# Patient Record
Sex: Female | Born: 2000 | Race: White | Hispanic: No | Smoking: Former smoker
Health system: Southern US, Community
[De-identification: ages and names within clinical notes are randomized; demographics above are authoritative.]

## PROBLEM LIST (undated history)

## (undated) DIAGNOSIS — F32A Depression, unspecified: Secondary | ICD-10-CM

## (undated) DIAGNOSIS — K219 Gastro-esophageal reflux disease without esophagitis: Secondary | ICD-10-CM

## (undated) DIAGNOSIS — J189 Pneumonia, unspecified organism: Secondary | ICD-10-CM

## (undated) HISTORY — PX: ADENOIDECTOMY: SUR15

## (undated) HISTORY — DX: Depression, unspecified: F32.A

## (undated) HISTORY — PX: TONSILLECTOMY: SUR1361

---
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
## 2022-01-02 ENCOUNTER — Ambulatory Visit: Payer: Medicaid Other

## 2023-03-03 ENCOUNTER — Ambulatory Visit (INDEPENDENT_AMBULATORY_CARE_PROVIDER_SITE_OTHER): Payer: MEDICAID | Admitting: Physician Assistant

## 2023-03-03 DIAGNOSIS — Z91199 Patient's noncompliance with other medical treatment and regimen due to unspecified reason: Secondary | ICD-10-CM

## 2023-03-03 NOTE — Progress Notes (Unsigned)
  New patient visit Patient was not seen for appt d/t no call, no show, or late arrival >10 mins past appt time.    Debera Lat PA [facility] [address] [place], [place] [address] [phone] (phone) [phone] (fax) [facility]

## 2023-03-13 ENCOUNTER — Encounter: Payer: MEDICAID | Admitting: Family Medicine

## 2023-05-28 ENCOUNTER — Encounter: Payer: Self-pay | Admitting: Nurse Practitioner

## 2023-05-28 ENCOUNTER — Ambulatory Visit: Payer: MEDICAID

## 2023-05-28 DIAGNOSIS — Z202 Contact with and (suspected) exposure to infections with a predominantly sexual mode of transmission: Secondary | ICD-10-CM

## 2023-05-28 DIAGNOSIS — Z113 Encounter for screening for infections with a predominantly sexual mode of transmission: Secondary | ICD-10-CM

## 2023-05-28 DIAGNOSIS — N926 Irregular menstruation, unspecified: Secondary | ICD-10-CM

## 2023-05-28 DIAGNOSIS — A749 Chlamydial infection, unspecified: Secondary | ICD-10-CM

## 2023-05-28 LAB — PREGNANCY, URINE: Preg Test, Ur: NEGATIVE

## 2023-05-28 LAB — WET PREP FOR TRICH, YEAST, CLUE
Trichomonas Exam: NEGATIVE
Yeast Exam: NEGATIVE

## 2023-05-28 LAB — HEPATITIS B SURFACE ANTIGEN

## 2023-05-28 LAB — HM HIV SCREENING LAB: HM HIV Screening: NEGATIVE

## 2023-05-28 LAB — HM HEPATITIS C SCREENING LAB: HM Hepatitis Screen: NEGATIVE

## 2023-05-28 MED ORDER — AZITHROMYCIN 500 MG PO TABS
1000.0000 mg | ORAL_TABLET | Freq: Once | ORAL | Status: AC
Start: 2023-05-28 — End: 2023-05-28
  Administered 2023-05-28: 1000 mg via ORAL

## 2023-05-28 NOTE — Progress Notes (Signed)
 Pt is here for STD testing and as a contact to Chlamydia.  Wet mount results reviewed, no treatment required per SO.   The patient was dispensed Azithromycin  1000 mg today. I provided counseling today regarding the medication. We discussed the medication, the side effects and when to call clinic. Patient given the opportunity to ask questions. Questions answered.  Condoms declined.  Berdie Ogren, RN

## 2023-06-01 LAB — GONOCOCCUS CULTURE

## 2023-06-04 NOTE — Progress Notes (Signed)
 [facility] Department STI clinic [address] [place] [address] Main phone: [phone]  STI screening visit  Subjective:  Jamie Mcdonald is a 23 y.o. female being seen today for an STI screening visit. The patient reports they do not have symptoms.  Patient reports that they do not desire a pregnancy in the next year.   They reported they are not interested in discussing contraception today.    Patient's last menstrual period was 04/03/2023.  Patient has the following medical conditions:  Patient Active Problem List   Diagnosis Date Noted   DMDD (disruptive mood dysregulation disorder) (HCC) 12/25/2018   Cholelithiasis with obstruction 02/27/2017    Chief Complaint  Patient presents with   SEXUALLY TRANSMITTED DISEASE    STD testing.  Contact to Chlamydia   Patient is a pleasant 23 y.o. female who presents to the office today requesting asymptomatic STI testing and treatment for chlamydia as she is a contact and tested positive at [facility] 11 days ago. Patient indicates 1 female partner in the last 2 months. She reports practicing vaginal and oral sex and uses condoms sometimes. Patient indicates a history of Chlamydia about 1.5 months ago. Patient reports last sex was 05/02/23 She indicates no use of contraceptive methods.  Patient indicates LMP was 04/03/23 (20-27 days lat).    Last HIV test per patient/review of record was  Lab Results  Component Value Date   HIV Non Reactive 07/11/2021     Last HEPC test per patient/review of record was No results found for: "HMHEPCSCREEN" No components found for: "HEPC"   Last HEPB test per patient/review of record was No components found for: "HMHEPBSCREEN"   Patient reports last pap was: No results found for: "DIAGPAP", "HPVHIGH", "ADEQPAP" No results found for: "SPECADGYN" No Cervical Cancer Screening results to display.  Screening for MPX risk: Does the patient have an unexplained  rash? No Is the patient MSM? No Does the patient endorse multiple sex partners or anonymous sex partners? No Did the patient have close or sexual contact with a person diagnosed with MPX? No Has the patient traveled outside the [place] where MPX is endemic? No Is there a high clinical suspicion for MPX-- evidenced by one of the following No  -Unlikely to be chickenpox  -Lymphadenopathy  -Rash that present in same phase of evolution on any given body part See flowsheet for further details and programmatic requirements.   Immunization history:  Immunization History  Administered Date(s) Administered   HPV 9-valent 04/12/2014   HPV Quadrivalent 12/14/2012   Hepatitis A 12/22/2007, 05/02/2009   Hepatitis B 22-Mar-2003, 11/25/2002, 07/21/2003   MMR 02/14/2004, 12/22/2007   Meningococcal B, OMV 10/31/2019   Meningococcal Mcv4o 04/12/2014, 10/31/2019   Pneumococcal Conjugate,unspecified 01/23/2003, 03/10/2003, 06/09/2003, 02/14/2004   Tdap 12/14/2012   Varicella 02/14/2004, 12/22/2007     The following portions of the patient's history were reviewed and updated as appropriate: allergies, current medications, past medical history, past social history, past surgical history and problem list.  Objective:  There were no vitals filed for this visit.  Physical Exam Nursing note reviewed.  Constitutional:      Appearance: Normal appearance.  HENT:     Head: Normocephalic.     Salivary Glands: Right salivary gland is not diffusely enlarged or tender. Left salivary gland is not diffusely enlarged or tender.     Mouth/Throat:     Lips: Pink. No lesions.     Mouth: Mucous membranes are moist.  Tongue: No lesions. Tongue does not deviate from midline.     Pharynx: Oropharynx is clear. Uvula midline. No oropharyngeal exudate or posterior oropharyngeal erythema.     Tonsils: No tonsillar exudate.  Eyes:     General:        Right eye: No discharge.        Left eye: No discharge.  Pulmonary:      Effort: Pulmonary effort is normal.  Genitourinary:    Comments: Patient asymptomatic. Declines genital exam. Self-swabbing.  Lymphadenopathy:     Head:     Right side of head: No submental, submandibular, tonsillar, preauricular or posterior auricular adenopathy.     Left side of head: No submental, submandibular, tonsillar, preauricular or posterior auricular adenopathy.     Cervical: No cervical adenopathy.     Right cervical: No superficial or posterior cervical adenopathy.    Left cervical: No superficial or posterior cervical adenopathy.     Upper Body:     Right upper body: No supraclavicular or axillary adenopathy.     Left upper body: No supraclavicular or axillary adenopathy.  Skin:    General: Skin is warm and dry.     Comments: Skin tone appropriate for ethnicity. Assessed exposed areas only and back.   Neurological:     Mental Status: She is alert and oriented to person, place, and time.  Psychiatric:        Attention and Perception: Attention and perception normal.        Mood and Affect: Mood and affect normal.        Speech: Speech normal.        Behavior: Behavior normal. Behavior is cooperative.        Thought Content: Thought content normal.     Assessment and Plan:  Jamie Mcdonald is a 23 y.o. female presenting to the [facility] Department for STI screening  1. Screening for venereal disease (Primary) Wet Prep negative in office today.  Retesting Chlamydia today. - Chlamydia/Gonorrhea [facility] - HBV Antigen/Antibody [facility] - HIV/HCV [place] [facility] - Syphilis Serology, [place] [facility] - Gonococcus culture - WET PREP FOR TRICH, YEAST, CLUE  2. Chlamydia Due to patient recent (05/17/23) positive test for Chlamydia at [facility] will treat per CDC guidelines using alternative regimen since pregnancy cannot be reasonably ruled out using the CDC guidelines. UPT obtained in office today was negative. Patient menstrual period late by around  20-27 days.  - Pregnancy, urine - azithromycin (ZITHROMAX) tablet 1,000 mg  3. Chlamydia contact Treating per CDC guidelines with alternative regimen since pregnancy cannot be ruled out per CDC guidelines.  - azithromycin (ZITHROMAX) tablet 1,000 mg  4. Menstrual period late Patient menstrual period late by around 20-27 days. UPT in office negative.  Advised patient to retake UPT at home in 2 weeks.  - Pregnancy, urine  Patient accepted all screenings including oral, vaginal CT/GC and bloodwork for HIV/RPR, and wet prep. Patient meets criteria for HepB screening? Yes. Ordered? yes Patient meets criteria for HepC screening? Yes. Ordered? yes  Treat wet prep per standing order Discussed time line for State [facility] results and that patient will be called with positive results and encouraged patient to call if she had not heard in 2 weeks.  Counseled to return or seek care for continued or worsening symptoms Recommended repeat testing in 3 months with positive results. Recommended condom use with all sex for STI prevention.   Patient is currently using  female condoms sometimes  to prevent pregnancy.    Return in about 4 weeks (around 06/25/2023), or if symptoms worsen or fail to improve, for TOC.  No future appointments.  Total time with patient 30 minutes.  Edmonia James, NP

## 2023-07-11 IMAGING — US US PELVIS COMPLETE TRANSABD/TRANSVAG W DUPLEX AND/OR DOPPLER
1 series · 13 of 25 positions shown · non-contrast
Comparison: None

CLINICAL DATA: Pelvic pain x2 days.

EXAM:
TRANSABDOMINAL AND TRANSVAGINAL ULTRASOUND OF PELVIS
TECHNIQUE: Both transabdominal and transvaginal ultrasound examinations of the
pelvis were performed. Transabdominal technique was performed for
global imaging of the pelvis including uterus, ovaries, adnexal
regions, and pelvic cul-de-sac. It was necessary to proceed with
endovaginal exam following the transabdominal exam to visualize the
bilateral ovaries.

[Series 1: us pelvic complete w transvaginal and torsion righ · 13 of 89 slices shown]
[im 1/89]
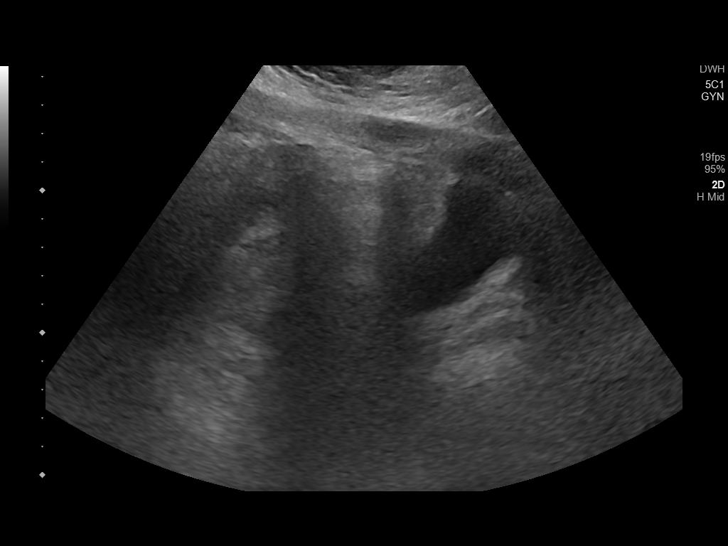
[im 8/89]
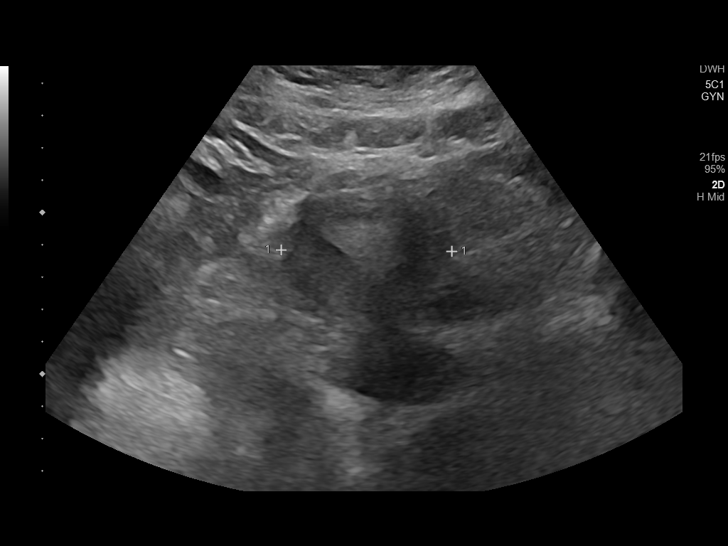
[im 15/89]
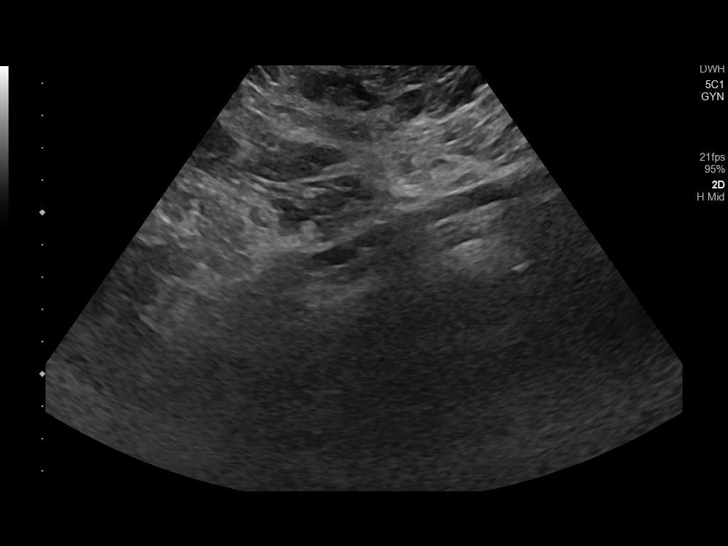
[im 23/89]
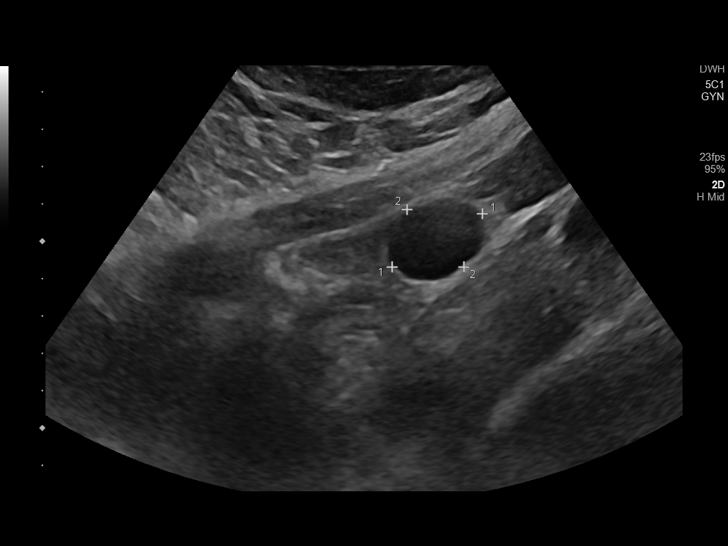
[im 30/89]
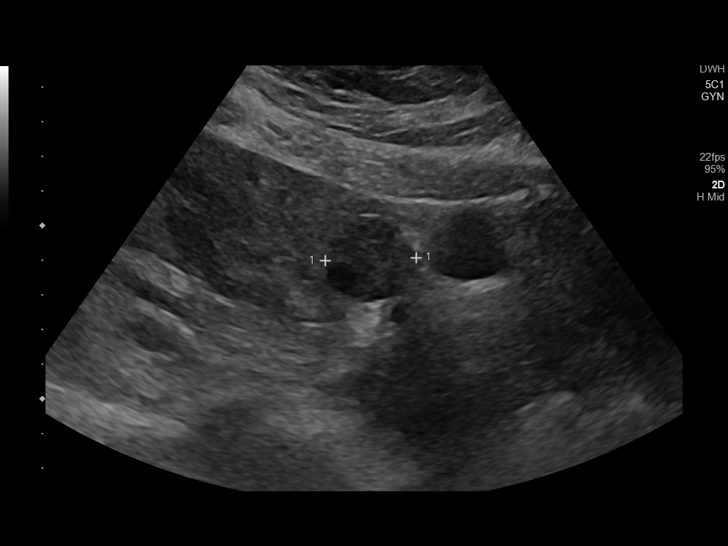
[im 37/89]
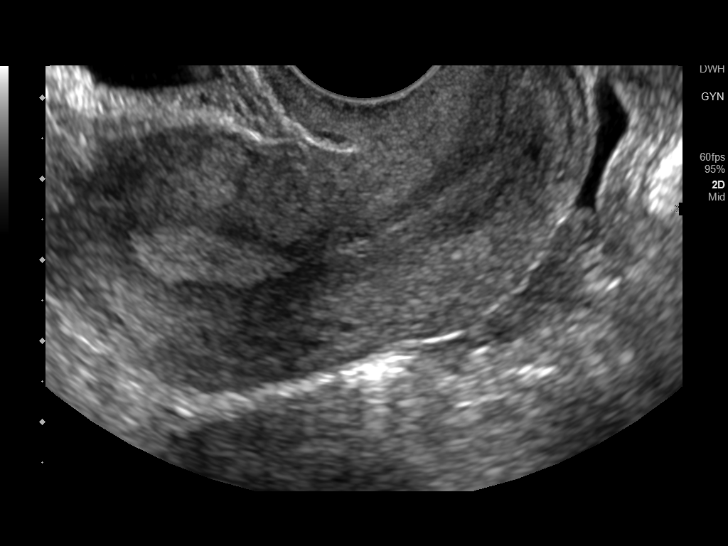
[im 45/89]
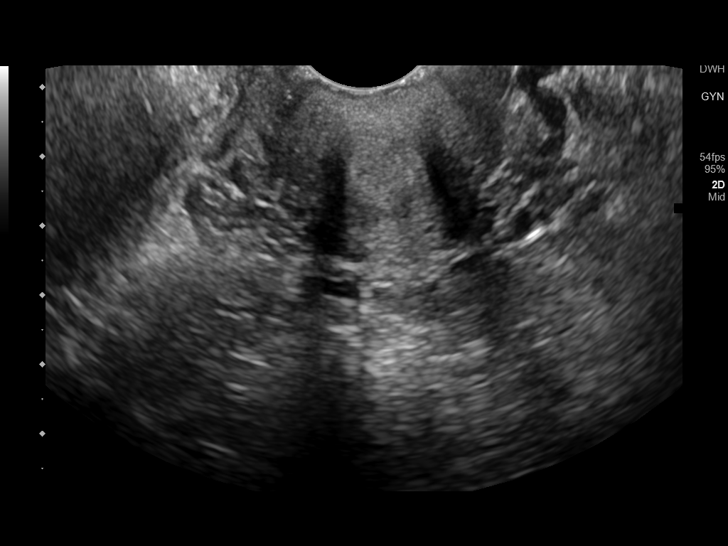
[im 52/89]
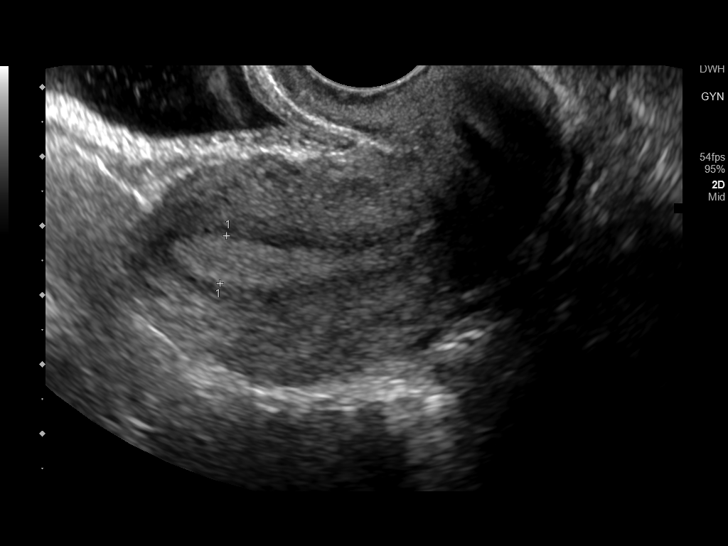
[im 59/89]
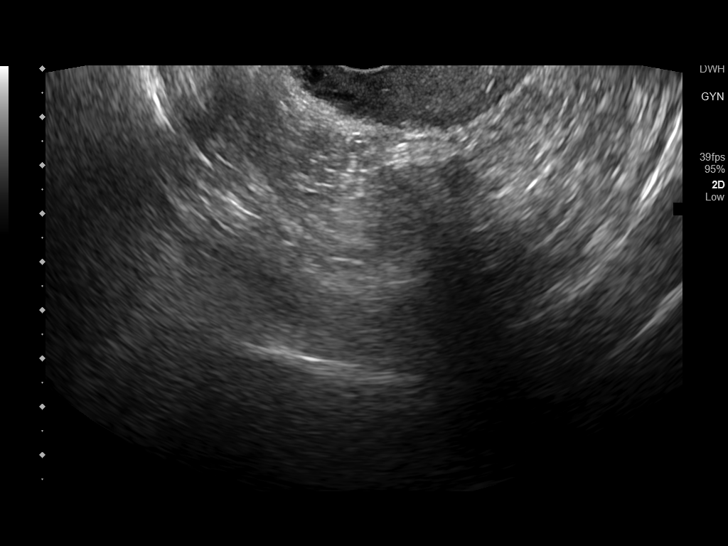
[im 67/89]
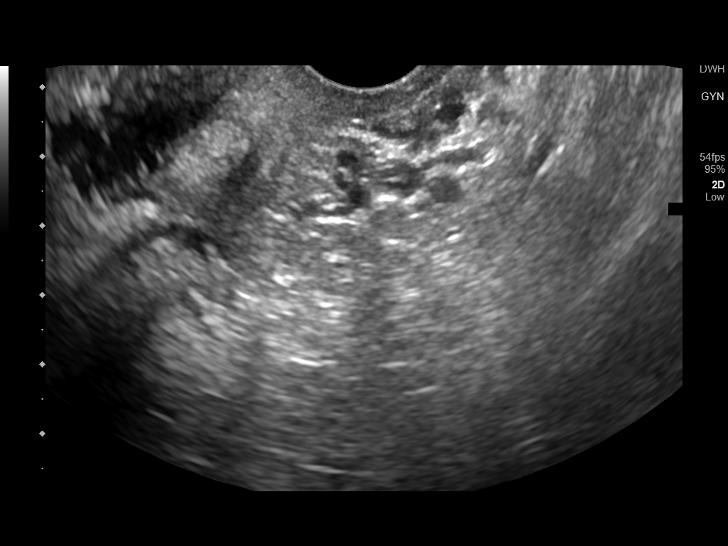
[im 74/89]
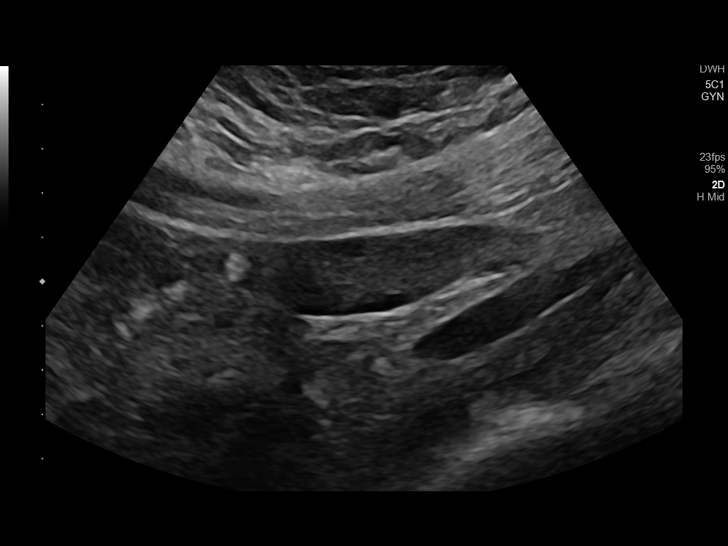
[im 81/89]
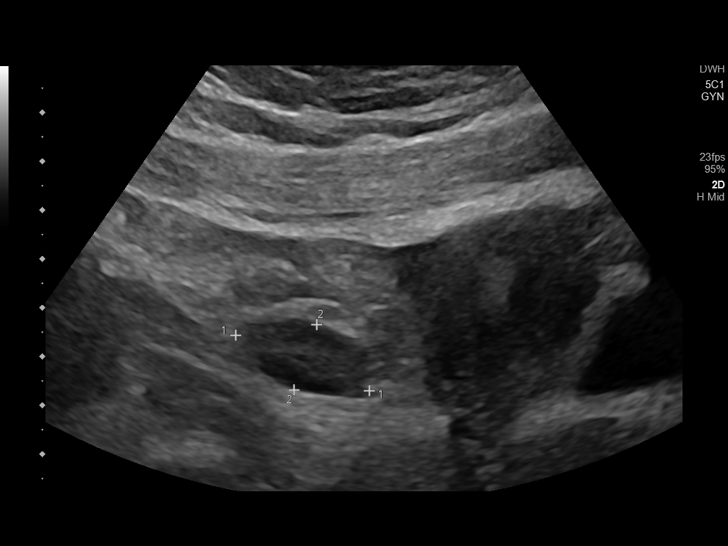
[im 89/89]
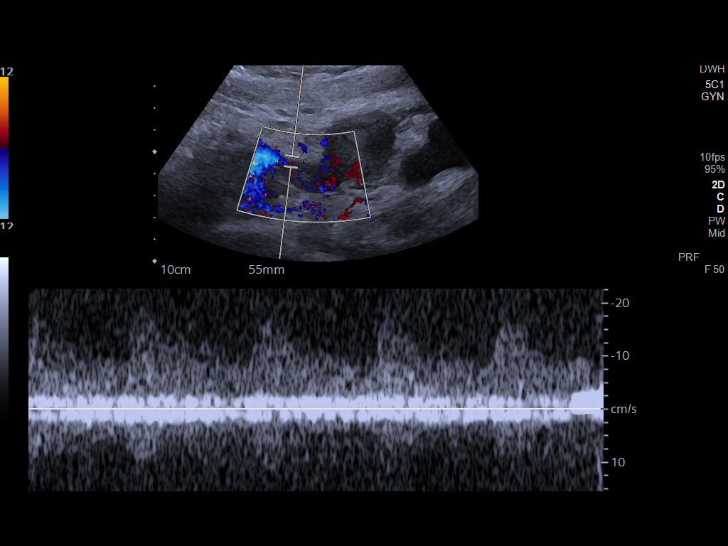

[13 of 25 positions shown; findings below may reference images not displayed]

FINDINGS: Uterus

Measurements: 7.0 cm x 3.4 cm x 5.4 cm = volume: 68.4 mL. No
fibroids or other mass visualized.

Endometrium

Thickness: 6.9 mm.  No focal abnormality visualized.

Right ovary

Measurements: 3.0 cm x 1.4 cm x 1.8 cm = volume: 4.0 mL. Normal
appearance/no adnexal mass.

Left ovary

Measurements: 4.8 cm x 2.0 cm x 2.6 cm = volume: 13.4 mL. A 2.5 cm x
2.2 cm x 2.5 cm anechoic structure is seen lateral to the left
ovary. No abnormal flow is seen within this region on color Doppler
evaluation.

Other findings

A small amount of pelvic free fluid is noted.
IMPRESSION: Findings consistent with a left para ovarian cyst. No follow-up
imaging is recommended. Reference: Radiology [DATE]):[id number]

## 2023-12-07 ENCOUNTER — Ambulatory Visit (LOCAL_COMMUNITY_HEALTH_CENTER): Payer: MEDICAID

## 2023-12-07 VITALS — BP 139/81 | Ht 67.0 in | Wt 279.0 lb

## 2023-12-07 DIAGNOSIS — Z309 Encounter for contraceptive management, unspecified: Secondary | ICD-10-CM

## 2023-12-07 DIAGNOSIS — Z3201 Encounter for pregnancy test, result positive: Secondary | ICD-10-CM

## 2023-12-07 LAB — PREGNANCY, URINE: Preg Test, Ur: POSITIVE — AB

## 2023-12-07 MED ORDER — PRENATAL 27-0.8 MG PO TABS: 1.0000 | ORAL_TABLET | Freq: Every day | ORAL | Status: AC

## 2023-12-07 NOTE — Progress Notes (Signed)
 UPT positive. Plans prenatal care at [facility].   Positive preg packet given and reviewed.   The patient was dispensed prenatal vitamins #100 today per SO Dr JAYSON Helling. I provided counseling today regarding the medication. We discussed the medication, the side effects and when to call clinic. Patient given the opportunity to ask questions. Questions answered.    Sent to clerk for presumptive elig/medicaid/preg women. Stephen Turnbaugh, RN

## 2023-12-14 ENCOUNTER — Ambulatory Visit: Payer: MEDICAID

## 2023-12-14 DIAGNOSIS — Z3481 Encounter for supervision of other normal pregnancy, first trimester: Secondary | ICD-10-CM

## 2023-12-14 NOTE — Telephone Encounter (Signed)
 Jamie Mcdonald
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
## 2023-12-29 ENCOUNTER — Other Ambulatory Visit: Payer: Self-pay

## 2023-12-29 ENCOUNTER — Ambulatory Visit: Payer: MEDICAID

## 2023-12-29 ENCOUNTER — Encounter: Payer: Self-pay | Admitting: Certified Nurse Midwife

## 2023-12-29 DIAGNOSIS — Z3A08 8 weeks gestation of pregnancy: Secondary | ICD-10-CM

## 2023-12-29 DIAGNOSIS — Z3481 Encounter for supervision of other normal pregnancy, first trimester: Secondary | ICD-10-CM

## 2023-12-29 DIAGNOSIS — O3680X0 Pregnancy with inconclusive fetal viability, not applicable or unspecified: Secondary | ICD-10-CM

## 2023-12-29 NOTE — Progress Notes (Signed)
Order for dating ultrasound placed

## 2024-01-12 ENCOUNTER — Encounter: Payer: MEDICAID | Admitting: Obstetrics

## 2024-01-15 ENCOUNTER — Emergency Department
Admission: EM | Admit: 2024-01-15 | Discharge: 2024-01-15 | Disposition: A | Discharge status: Home / Self Care | Attending: Emergency Medicine | Admitting: Emergency Medicine

## 2024-01-15 ENCOUNTER — Emergency Department

## 2024-01-15 ENCOUNTER — Other Ambulatory Visit: Payer: Self-pay

## 2024-01-15 DIAGNOSIS — R109 Unspecified abdominal pain: Secondary | ICD-10-CM | POA: Diagnosis not present

## 2024-01-15 DIAGNOSIS — O209 Hemorrhage in early pregnancy, unspecified: Secondary | ICD-10-CM | POA: Insufficient documentation

## 2024-01-15 DIAGNOSIS — Z3A09 9 weeks gestation of pregnancy: Secondary | ICD-10-CM | POA: Insufficient documentation

## 2024-01-15 DIAGNOSIS — O4690 Antepartum hemorrhage, unspecified, unspecified trimester: Secondary | ICD-10-CM

## 2024-01-15 LAB — CBC WITH DIFFERENTIAL/PLATELET
Abs Immature Granulocytes: 0.06 K/uL (ref 0.00–0.07)
Basophils Absolute: 0 K/uL (ref 0.0–0.1)
Basophils Relative: 1 %
Eosinophils Absolute: 0.3 K/uL (ref 0.0–0.5)
Eosinophils Relative: 4 %
HCT: 41.3 % (ref 36.0–46.0)
Hemoglobin: 13.1 g/dL (ref 12.0–15.0)
Immature Granulocytes: 1 %
Lymphocytes Relative: 31 %
Lymphs Abs: 2.4 K/uL (ref 0.7–4.0)
MCH: 26.1 pg (ref 26.0–34.0)
MCHC: 31.7 g/dL (ref 30.0–36.0)
MCV: 82.4 fL (ref 80.0–100.0)
Monocytes Absolute: 0.5 K/uL (ref 0.1–1.0)
Monocytes Relative: 7 %
Neutro Abs: 4.5 K/uL (ref 1.7–7.7)
Neutrophils Relative %: 56 %
Platelets: 258 K/uL (ref 150–400)
RBC: 5.01 MIL/uL (ref 3.87–5.11)
RDW: 15.9 % — ABNORMAL HIGH (ref 11.5–15.5)
WBC: 7.9 K/uL (ref 4.0–10.5)
nRBC: 0 % (ref 0.0–0.2)

## 2024-01-15 LAB — ABO/RH: ABO/RH(D): A POS

## 2024-01-15 LAB — HCG, QUANTITATIVE, PREGNANCY: hCG, Beta Chain, Quant, S: 6517 m[IU]/mL — ABNORMAL HIGH (ref <5)

## 2024-01-15 NOTE — Discharge Instructions (Addendum)
 Your evaluated in the ED for vaginal bleeding during early pregnancy.  Your ultrasound shows no cardiac activity of the fetus.  It is important to follow-up for further evaluation with your OB/GYN for further steps of this process.  Please call and schedule appointment at your earliest convenience
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
## 2024-01-15 NOTE — ED Triage Notes (Signed)
 Patient states lower abdominal cramping and vaginal bleeding; states she is [redacted] weeks pregnant.

## 2024-01-15 NOTE — ED Provider Notes (Signed)
-----------------------------------------   2:54 PM on 01/15/2024 -----------------------------------------  Blood pressure 127/86, pulse 89, temperature 98.1 F (36.7 C), temperature source Oral, resp. rate 20, height 5' 7 (1.702 m), last menstrual period 10/20/2023, SpO2 97%.  Assuming care from [employer], PA-C.  In short, Jamie Mcdonald is a 23 y.o. female with a chief complaint of Vaginal Bleeding .  Refer to the original H&P for additional details.  The current plan of care is to await ultrasound and make appropriate disposition.  ----------------------------------------- 4:46 PM on 01/15/2024 -----------------------------------------  Results updated to patient.  Discussed with [name], CNM of [facility] who advised patient to call office for follow-up visit in 1 week.  Patient verbalized understanding.  Expectation management education provided to patient at discharge.  She is in stable condition.  Clinical Course as of 01/15/24 1650  Fri Jan 15, 2024  1214 CBC with Differential/Platelet(!) Unremarkable, no leukocytosis or anemia. [LD]  1421 ABO/Rh No indication for Rhogam. [LD]  1500 Care of the patient will be passed on to the oncoming provider pending UA and ultrasound results. [LD]  1650 US  OB LESS THAN 14 WEEKS WITH OB TRANSVAGINAL IMPRESSION: Single intrauterine gestation with an estimated gestational age of [redacted] weeks and 0 days. No fetal heartbeat present during the exam. Given the CRL of >64mm with no fetal heartbeat, these findings are diagnostic of a failed first trimester pregnancy. Gynecologic consultation recommended.   [MH]    Clinical Course User Index [LD] Cleaster Tinnie LABOR, PA-C [MH] [name], [name], PA-C 01/15/24 1651    [name], MD 01/15/24 1911

## 2024-01-16 ENCOUNTER — Other Ambulatory Visit: Payer: Self-pay

## 2024-01-16 ENCOUNTER — Emergency Department

## 2024-01-16 ENCOUNTER — Emergency Department
Admission: EM | Admit: 2024-01-16 | Discharge: 2024-01-16 | Disposition: A | Discharge status: Home / Self Care | Attending: Emergency Medicine | Admitting: Emergency Medicine

## 2024-01-16 DIAGNOSIS — Z3A09 9 weeks gestation of pregnancy: Secondary | ICD-10-CM | POA: Diagnosis not present

## 2024-01-16 DIAGNOSIS — O209 Hemorrhage in early pregnancy, unspecified: Secondary | ICD-10-CM | POA: Diagnosis present

## 2024-01-16 DIAGNOSIS — O034 Incomplete spontaneous abortion without complication: Secondary | ICD-10-CM | POA: Insufficient documentation

## 2024-01-16 LAB — CBC
HCT: 37.4 % (ref 36.0–46.0)
Hemoglobin: 12.1 g/dL (ref 12.0–15.0)
MCH: 26.5 pg (ref 26.0–34.0)
MCHC: 32.4 g/dL (ref 30.0–36.0)
MCV: 82 fL (ref 80.0–100.0)
Platelets: 253 K/uL (ref 150–400)
RBC: 4.56 MIL/uL (ref 3.87–5.11)
RDW: 15.8 % — ABNORMAL HIGH (ref 11.5–15.5)
WBC: 10.9 K/uL — ABNORMAL HIGH (ref 4.0–10.5)
nRBC: 0 % (ref 0.0–0.2)

## 2024-01-16 LAB — COMPREHENSIVE METABOLIC PANEL WITH GFR
ALT: 49 U/L — ABNORMAL HIGH (ref 0–44)
AST: 41 U/L (ref 15–41)
Albumin: 3.9 g/dL (ref 3.5–5.0)
Alkaline Phosphatase: 46 U/L (ref 38–126)
Anion gap: 11 (ref 5–15)
BUN: 7 mg/dL (ref 6–20)
CO2: 20 mmol/L — ABNORMAL LOW (ref 22–32)
Calcium: 8.7 mg/dL — ABNORMAL LOW (ref 8.9–10.3)
Chloride: 105 mmol/L (ref 98–111)
Creatinine, Ser: 0.68 mg/dL (ref 0.44–1.00)
GFR, Estimated: >60 mL/min (ref >60)
Glucose, Bld: 120 mg/dL — ABNORMAL HIGH (ref 70–99)
Potassium: 3.4 mmol/L — ABNORMAL LOW (ref 3.5–5.1)
Sodium: 136 mmol/L (ref 135–145)
Total Bilirubin: 0.6 mg/dL (ref 0.0–1.2)
Total Protein: 7.4 g/dL (ref 6.5–8.1)

## 2024-01-16 LAB — HCG, QUANTITATIVE, PREGNANCY: hCG, Beta Chain, Quant, S: 3188 m[IU]/mL — ABNORMAL HIGH (ref <5)

## 2024-01-16 MED ORDER — ACETAMINOPHEN 500 MG PO TABS
1000.0000 mg | ORAL_TABLET | Freq: Once | ORAL | Status: DC
  Filled 2024-01-16: qty 2

## 2024-01-16 MED ORDER — MISOPROSTOL 200 MCG PO TABS
800.0000 ug | ORAL_TABLET | Freq: Once | ORAL | Status: AC
  Administered 2024-01-16: 800 ug via ORAL
  Filled 2024-01-16: qty 4

## 2024-01-16 MED ORDER — SODIUM CHLORIDE 0.9 % IV BOLUS
1000.0000 mL | Freq: Once | INTRAVENOUS | Status: AC
  Administered 2024-01-16: 1000 mL via INTRAVENOUS

## 2024-01-16 MED ORDER — OXYCODONE-ACETAMINOPHEN 5-325 MG PO TABS: 1.0000 | ORAL_TABLET | Freq: Four times a day (QID) | ORAL | 0 refills | Status: AC | PRN

## 2024-01-16 MED ORDER — OXYCODONE-ACETAMINOPHEN 5-325 MG PO TABS
1.0000 | ORAL_TABLET | Freq: Once | ORAL | Status: AC
  Administered 2024-01-16: 1 via ORAL
  Filled 2024-01-16: qty 1

## 2024-01-16 MED ORDER — ACETAMINOPHEN 10 MG/ML IV SOLN: 1000.0000 mg | Freq: Four times a day (QID) | INTRAVENOUS | Status: DC

## 2024-01-16 MED ADMIN — Acetaminophen IV Soln 10 MG/ML: 1000 mg | INTRAVENOUS | NDC 63323043441

## 2024-01-16 MED FILL — Acetaminophen IV Soln 10 MG/ML: 1000.0000 mg | INTRAVENOUS | Qty: 100 | Status: AC

## 2024-01-16 NOTE — ED Triage Notes (Signed)
 Patient reports that she was informed she is miscarrying yesterday in this ED. Reports bleeding has increased. Passing large clots. Reports episode of near syncope.

## 2024-01-16 NOTE — Discharge Instructions (Addendum)
 Ibuprofen  and/or Tylenol  as needed for pain, and the oxycodone only if needed for severe pain that is not relieved by the over-the-counter medications.  Call [facility] on Monday to schedule a follow-up visit in the next several days.  In the meantime, return to the ER immediately for new, worsening, or persistent severe pain, worsening bleeding or passage of more large clots, dizziness or feel like you are going to pass out, or any other new or worsening symptoms that concern you.

## 2024-01-16 NOTE — ED Notes (Signed)
 Pt ambulated to toilet in hallway to have BM. Pt unable to provide urine at this time.

## 2024-01-16 NOTE — ED Notes (Signed)
 Assisted Dr. Jacolyn while doing a pelvic exam on the pt.

## 2024-01-16 NOTE — ED Provider Notes (Signed)
 [facility] Provider Note    Event Date/Time   First MD Initiated Contact with Patient 01/16/24 1833     (approximate)   History   Vaginal Bleeding   HPI  Jamie Mcdonald is a 23 y.o. female G2P0010 with history of GERD and depression who presents with vaginal bleeding and lower abdominal cramping.  The patient was diagnosed with a failed pregnancy yesterday and was having a small amount of bleeding.  This afternoon into this evening, she started having more severe bleeding, severe cramps, and then passed several very large clots, 1 clot that appeared larger than a baseball.  On the way here she started to feel very lightheaded like she might pass out although this has subsided somewhat.  The cramping is improved currently.  However, she is still bleeding, and has used 4 pads in the last 3 hours.  I reviewed the past medical records.  The patient was seen in the ED yesterday with the abdominal cramping and vaginal bleeding.  Ultrasound showed an intrauterine gestation with no fetal heartbeat concerning for failed first trimester pregnancy.  OB was consulted and recommended expectant management with close outpatient follow-up next week.   Physical Exam   Triage Vital Signs: ED Triage Vitals  Encounter Vitals Group     BP 01/16/24 1830 115/77     Girls Systolic BP Percentile --      Girls Diastolic BP Percentile --      Boys Systolic BP Percentile --      Boys Diastolic BP Percentile --      Pulse Rate 01/16/24 1830 (!) 105     Resp 01/16/24 1830 19     Temp 01/16/24 1830 97.9 F (36.6 C)     Temp src --      SpO2 01/16/24 1830 100 %     Weight 01/16/24 1827 279 lb 1.6 oz (126.6 kg)     Height 01/16/24 1827 5' 7 (1.702 m)     Head Circumference --      Peak Flow --      Pain Score 01/16/24 1827 8     Pain Loc --      Pain Education --      Exclude from Growth Chart --     Most recent vital signs: Vitals:   01/16/24 1830  BP: 115/77  Pulse:  (!) 105  Resp: 19  Temp: 97.9 F (36.6 C)  SpO2: 100%     General: Awake, no distress.  CV:  Good peripheral perfusion.  Resp:  Normal effort.  Abd:  Soft with no focal tenderness.  No distention.  Other:  Actively passing blood and clots on pelvic exam although no significant pooling of blood.   ED Results / Procedures / Treatments   Labs (all labs ordered are listed, but only abnormal results are displayed) Labs Reviewed  COMPREHENSIVE METABOLIC PANEL WITH GFR - Abnormal; Notable for the following components:      Result Value   Potassium 3.4 (*)    CO2 20 (*)    Glucose, Bld 120 (*)    Calcium 8.7 (*)    ALT 49 (*)    All other components within normal limits  CBC - Abnormal; Notable for the following components:   WBC 10.9 (*)    RDW 15.8 (*)    All other components within normal limits  URINALYSIS, ROUTINE W REFLEX MICROSCOPIC  HCG, QUANTITATIVE, PREGNANCY     EKG  ED ECG REPORT  IWaylon Cassis, the attending physician, personally viewed and interpreted this ECG.  Date: 01/16/2024 EKG Time: 1838 Rate: 99 Rhythm: normal sinus rhythm QRS Axis: Borderline right axis Intervals: normal ST/T Wave abnormalities: normal Narrative Interpretation: no evidence of acute ischemia    RADIOLOGY  US  OB: I independently viewed and interpreted the images; there is a small amount of material in the lower uterine cavity.  Radiology report indicates the following:  IMPRESSION:  1. Findings consistent with near-complete abortion in progress. Short interval  follow-up exam recommended to confirm completion.  2. Avascular material in the lower uterine cavity, likely blood clot.  3. Cervical canal appears open with avascular material of similar density .  4. No adnexal mass or free fluid identified.    PROCEDURES:  Critical Care performed: No  Procedures   MEDICATIONS ORDERED IN ED: Medications  sodium chloride  0.9 % bolus 1,000 mL (0 mLs Intravenous  Stopped 01/16/24 2117)  acetaminophen  (OFIRMEV ) IV 1,000 mg (0 mg Intravenous Stopped 01/16/24 2045)  misoprostol (CYTOTEC) tablet 800 mcg (800 mcg Oral Given 01/16/24 2108)  oxyCODONE-acetaminophen  (PERCOCET/ROXICET) 5-325 MG per tablet 1 tablet (1 tablet Oral Given 01/16/24 2107)     IMPRESSION / MDM / ASSESSMENT AND PLAN / ED COURSE  I reviewed the triage vital signs and the nursing notes.  23 year old female with PMH as noted above, diagnosed with failed first trimester pregnancy yesterday now presents with more severe bleeding and cramping including passing large clots and possible tissue.  She had a near syncopal event while on the way here.  Differential diagnosis includes, but is not limited to, incomplete miscarriage.  We will obtain lab workup, fluid bolus, consult OB/GYN, and reassess.  Patient's presentation is most consistent with acute presentation with potential threat to life or bodily function.  ----------------------------------------- 8:02 PM on 01/16/2024 -----------------------------------------  I consulted and discussed the case with Dr. Rayma from OB/GYN.  She requested an ultrasound.  The patient's tachycardia is improved.  ----------------------------------------- 9:27 PM on 01/16/2024 -----------------------------------------  Ultrasound shows a likely near complete abortion.  Lab workup is unremarkable.  CMP shows no acute findings.  CBC shows normal hemoglobin.  On reassessment the patient is more comfortable.  The rate of bleeding has decreased.  Her heart rate is in the 90s.  Dr. Rayma evaluated the patient in person.  She recommends a dose of Cytotec here and discharged with a small quantity of Percocet for pain with close follow-up at [facility] early this week.  The patient feels comfortable going home and is in agreement with the plan.  She is stable for discharge at this time.  I gave her strict return precautions, and she expressed  understanding.   FINAL CLINICAL IMPRESSION(S) / ED DIAGNOSES   Final diagnoses:  Incomplete miscarriage     Rx / DC Orders   ED Discharge Orders          Ordered    oxyCODONE-acetaminophen  (PERCOCET) 5-325 MG tablet  Every 6 hours PRN        01/16/24 2127             Note:  This document was prepared using Dragon voice recognition software and may include unintentional dictation errors.    Cassis Waylon, MD 01/16/24 2128

## 2024-01-16 NOTE — Consult Note (Signed)
 HPI:  This 80bn [id number] with an IUP @ 9 weeks presents to [facility] ED c/o vaginal bleeding, which started yesterday.  Pt present to the ED yesterday and underwent an US , which showed  a GS, but No YS @ 9 weeks.   Her LMP is 10/20/23.  Today, she states that the bleeding started earlier today and she passed several large clots.  She was having intense cramping also.   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 Incomplete Abortion -give Cytotec 800 mcg po --Rx Ibuprofen , Tylenol  and (3) Oxycodone  2. A Pos  --Rhogam not indicated  3. Obesity  4.  D/C home -F/U with [facility] Monday or Tuesday  -pelvic rest until otherwise instructed

## 2024-01-18 ENCOUNTER — Telehealth: Payer: Self-pay

## 2024-01-18 NOTE — Telephone Encounter (Signed)
 Returned call to patient re: miscarriage.  Pt has been experiencing bleeding and cramping and her u/s 10/17 showed a fetus with no cardiac activity and her follow up u/s 10/18 showed no fetus and almost complete miscarriage.  She says her bleeding has slowed to a slightly heavy period and she is having minor back pain.  She was offered reassurance that it looks like her miscarriage is mostly complete and her symptoms should be improving.  She was offered a follow-up appointment in clinic tomorrow, but as her ultrasound showed the pregnancy passed and her symptoms are mild, it is unlikely she will need further treatment.  She says she would rather manage at home and let us  know if she has complications.  Bleeding precautions given.  Pt will call if she develops heavy vaginal bleeding, pain worsens, or she develops a fever.

## 2024-01-19 ENCOUNTER — Ambulatory Visit: Admitting: Obstetrics

## 2024-01-25 ENCOUNTER — Encounter: Payer: MEDICAID | Admitting: Obstetrics
